# Patient Record
Sex: Male | Born: 1995 | Race: White | Hispanic: No | Marital: Single | Smoking: Never smoker
Health system: Southern US, Community
[De-identification: ages and names within clinical notes are randomized; demographics above are authoritative.]

## PROBLEM LIST (undated history)

## (undated) DIAGNOSIS — W540XXA Bitten by dog, initial encounter: Secondary | ICD-10-CM

---
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
## 2013-06-06 DIAGNOSIS — S99919A Unspecified injury of unspecified ankle, initial encounter: Principal | ICD-10-CM

## 2013-06-06 DIAGNOSIS — Y9301 Activity, walking, marching and hiking: Secondary | ICD-10-CM | POA: Insufficient documentation

## 2013-06-06 DIAGNOSIS — Y929 Unspecified place or not applicable: Secondary | ICD-10-CM | POA: Insufficient documentation

## 2013-06-06 DIAGNOSIS — S9000XA Contusion of unspecified ankle, initial encounter: Secondary | ICD-10-CM | POA: Insufficient documentation

## 2013-06-06 DIAGNOSIS — X500XXA Overexertion from strenuous movement or load, initial encounter: Secondary | ICD-10-CM | POA: Insufficient documentation

## 2013-06-06 DIAGNOSIS — Z791 Long term (current) use of non-steroidal anti-inflammatories (NSAID): Secondary | ICD-10-CM | POA: Insufficient documentation

## 2013-06-06 DIAGNOSIS — S8990XA Unspecified injury of unspecified lower leg, initial encounter: Secondary | ICD-10-CM | POA: Insufficient documentation

## 2013-06-06 DIAGNOSIS — Z880 Allergy status to penicillin: Secondary | ICD-10-CM | POA: Insufficient documentation

## 2013-06-06 DIAGNOSIS — S99929A Unspecified injury of unspecified foot, initial encounter: Principal | ICD-10-CM

## 2013-06-07 ENCOUNTER — Encounter (HOSPITAL_COMMUNITY): Payer: Self-pay | Admitting: Emergency Medicine

## 2013-06-07 ENCOUNTER — Emergency Department (HOSPITAL_COMMUNITY): Payer: Medicaid Other

## 2013-06-07 ENCOUNTER — Emergency Department (HOSPITAL_COMMUNITY)
Admission: EM | Admit: 2013-06-07 | Discharge: 2013-06-07 | Disposition: A | Discharge status: Home / Self Care | Payer: Medicaid Other | Attending: Emergency Medicine | Admitting: Emergency Medicine

## 2013-06-07 DIAGNOSIS — M25572 Pain in left ankle and joints of left foot: Secondary | ICD-10-CM

## 2013-06-07 HISTORY — DX: Bitten by dog, initial encounter: W54.0XXA

## 2013-06-07 MED ORDER — IBUPROFEN 800 MG PO TABS: 800.0000 mg | ORAL_TABLET | Freq: Three times a day (TID) | ORAL | Status: DC

## 2013-06-07 MED ORDER — IBUPROFEN 400 MG PO TABS
800.0000 mg | ORAL_TABLET | Freq: Once | ORAL | Status: AC
  Administered 2013-06-07: 800 mg via ORAL
  Filled 2013-06-07: qty 2

## 2013-06-07 NOTE — ED Notes (Signed)
Pt reports he fell down steps and twisted his L ankle. Pms intact. Ankle appears slightly swollen

## 2013-06-07 NOTE — Discharge Instructions (Signed)
Take Ibuprofen for pain  Use RICE method - see below Return to the emergency department if you develop any changing/worsening condition or any other concerns (please read additional information regarding your condition below)   Ankle Pain Ankle pain is a common symptom. The bones, cartilage, tendons, and muscles of the ankle joint perform a lot of work each day. The ankle joint holds your body weight and allows you to move around. Ankle pain can occur on either side or back of 1 or both ankles. Ankle pain may be sharp and burning or dull and aching. There may be tenderness, stiffness, redness, or warmth around the ankle. The pain occurs more often when a person walks or puts pressure on the ankle. CAUSES  There are many reasons ankle pain can develop. It is important to work with your caregiver to identify the cause since many conditions can impact the bones, cartilage, muscles, and tendons. Causes for ankle pain include:  Injury, including a break (fracture), sprain, or strain often due to a fall, sports, or a high-impact activity.  Swelling (inflammation) of a tendon (tendonitis).  Achilles tendon rupture.  Ankle instability after repeated sprains and strains.  Poor foot alignment.  Pressure on a nerve (tarsal tunnel syndrome).  Arthritis in the ankle or the lining of the ankle.  Crystal formation in the ankle (gout or pseudogout). DIAGNOSIS  A diagnosis is based on your medical history, your symptoms, results of your physical exam, and results of diagnostic tests. Diagnostic tests may include X-ray exams or a computerized magnetic scan (magnetic resonance imaging, MRI). TREATMENT  Treatment will depend on the cause of your ankle pain and may include:  Keeping pressure off the ankle and limiting activities.  Using crutches or other walking support (a cane or brace).  Using rest, ice, compression, and elevation.  Participating in physical therapy or home exercises.  Wearing  shoe inserts or special shoes.  Losing weight.  Taking medications to reduce pain or swelling or receiving an injection.  Undergoing surgery. HOME CARE INSTRUCTIONS   Only take over-the-counter or prescription medicines for pain, discomfort, or fever as directed by your caregiver.  Put ice on the injured area.  Put ice in a plastic bag.  Place a towel between your skin and the bag.  Leave the ice on for 15-20 minutes at a time, 03-04 times a day.  Keep your leg raised (elevated) when possible to lessen swelling.  Avoid activities that cause ankle pain.  Follow specific exercises as directed by your caregiver.  Record how often you have ankle pain, the location of the pain, and what it feels like. This information may be helpful to you and your caregiver.  Ask your caregiver about returning to work or sports and whether you should drive.  Follow up with your caregiver for further examination, therapy, or testing as directed. SEEK MEDICAL CARE IF:   Pain or swelling continues or worsens beyond 1 week.  You have an oral temperature above 102 F (38.9 C).  You are feeling unwell or have chills.  You are having an increasingly difficult time with walking.  You have loss of sensation or other new symptoms.  You have questions or concerns. MAKE SURE YOU:   Understand these instructions.  Will watch your condition.  Will get help right away if you are not doing well or get worse. Document Released: 06/18/2009 Document Revised: 03/23/2011 Document Reviewed: 06/18/2009 [facility] Patient Information 2014 [place].  RICE: Routine Care for Injuries The  routine care of many injuries includes Rest, Ice, Compression, and Elevation (RICE). HOME CARE INSTRUCTIONS  Rest is needed to allow your body to heal. Routine activities can usually be resumed when comfortable. Injured tendons and bones can take up to 6 weeks to heal. Tendons are the cord-like structures that attach  muscle to bone.  Ice following an injury helps keep the swelling down and reduces pain.  Put ice in a plastic bag.  Place a towel between your skin and the bag.  Leave the ice on for 15-20 minutes, 03-04 times a day. Do this while awake, for the first 24 to 48 hours. After that, continue as directed by your caregiver.  Compression helps keep swelling down. It also gives support and helps with discomfort. If an elastic bandage has been applied, it should be removed and reapplied every 3 to 4 hours. It should not be applied tightly, but firmly enough to keep swelling down. Watch fingers or toes for swelling, bluish discoloration, coldness, numbness, or excessive pain. If any of these problems occur, remove the bandage and reapply loosely. Contact your caregiver if these problems continue.  Elevation helps reduce swelling and decreases pain. With extremities, such as the arms, hands, legs, and feet, the injured area should be placed near or above the level of the heart, if possible. SEEK IMMEDIATE MEDICAL CARE IF:  You have persistent pain and swelling.  You develop redness, numbness, or unexpected weakness.  Your symptoms are getting worse rather than improving after several days. These symptoms may indicate that further evaluation or further X-rays are needed. Sometimes, X-rays may not show a small broken bone (fracture) until 1 week or 10 days later. Make a follow-up appointment with your caregiver. Ask when your X-ray results will be ready. Make sure you get your X-ray results. Document Released: 04/12/2000 Document Revised: 03/23/2011 Document Reviewed: 05/30/2010 [facility] Patient Information 2014 [place], [place].

## 2013-06-08 NOTE — ED Provider Notes (Signed)
Medical screening examination/treatment/procedure(s) were performed by non-physician practitioner and as supervising physician I was immediately available for consultation/collaboration.   EKG Interpretation None        Julia Alkhatib M Deah Ottaway, MD 06/08/13 1546 

## 2013-06-08 NOTE — ED Provider Notes (Signed)
CSN: [id number]     Arrival date & time 06/06/13  2335 History   First MD Initiated Contact with Patient 06/07/13 [id number]     Chief Complaint  Patient presents with  . Ankle Pain   HPI  Alan Potter is a 18 y.o. male with no PMH who presents to the ED for evaluation of ankle pain. History was provided by the patient. Patient states that he was walking down the stairs this evening when he missed his step and twisted his left ankle. Denies any other injuries or trauma including head injury or LOC. Patient complains of a constant throbbing left lateral ankle pain which is worse with movement. Nothing improves his pain. Nothing taken for pain PTA. No weakness, loss of sensation, numbness or tingling.    Past Medical History  Diagnosis Date  . Dog bite     mauled by pit bull as child.    History reviewed. No pertinent past surgical history. No family history on file. History  Substance Use Topics  . Smoking status: Never Smoker   . Smokeless tobacco: Not on file  . Alcohol Use: Yes     Comment: occasionally    Review of Systems  Constitutional: Negative for fever, activity change and appetite change.  Cardiovascular: Negative for leg swelling.  Musculoskeletal: Positive for arthralgias, gait problem (due to pain) and joint swelling. Negative for back pain, myalgias and neck pain.  Skin: Positive for color change. Negative for wound.  Neurological: Negative for weakness and numbness.     Allergies  Hydrocodone and Penicillins  Home Medications   Prior to Admission medications   Medication Sig Start Date End Date Taking? Authorizing Provider  ibuprofen (ADVIL,MOTRIN) 800 MG tablet Take 1 tablet (800 mg total) by mouth 3 (three) times daily. 06/07/13   Alan Harvey Ladarian Bonczek, PA-C   BP 131/98  Pulse 98  Temp(Src) 98.9 F (37.2 C) (Oral)  Resp 14  SpO2 100%  Filed Vitals:   06/07/13 0228  BP: 131/98  Pulse: 98  Temp: 98.9 F (37.2 C)  TempSrc: Oral  Resp: 14  SpO2: 100%     Physical Exam  Nursing note and vitals reviewed. Constitutional: He is oriented to person, place, and time. He appears well-developed and well-nourished. No distress.  HENT:  Head: Normocephalic and atraumatic.  Right Ear: External ear normal.  Left Ear: External ear normal.  Mouth/Throat: Oropharynx is clear and moist.  Eyes: Conjunctivae are normal. Right eye exhibits no discharge. Left eye exhibits no discharge.  Neck: Neck supple.  Cardiovascular: Normal rate.   Dorsalis pedis pulses present and equal bilaterally  Pulmonary/Chest: Effort normal.  Abdominal: Soft.  Musculoskeletal: Normal range of motion. He exhibits edema and tenderness.       Feet:  Tenderness to palpation to the left lateral malleolus with associated edema and ecchymosis. No tenderness to palpation to the digits, calf, or knee. ROM intact in the digits and is limited in the left ankle due to pain.   Neurological: He is alert and oriented to person, place, and time.  Sensation intact in the LE bilaterally  Skin: Skin is warm and dry. He is not diaphoretic.    ED Course  Procedures (including critical care time) Labs Review Labs Reviewed - No data to display  Imaging Review Dg Ankle Complete Left  06/07/2013   CLINICAL DATA:  Twisting injury to left ankle while on steps. Left ankle pain.  EXAM: LEFT ANKLE COMPLETE - 3+ VIEW  COMPARISON:  None.  FINDINGS: There is no evidence of fracture or dislocation. The ankle mortise is intact; the interosseous space is within normal limits. No talar tilt or subluxation is seen.  The joint spaces are preserved. Mild lateral soft tissue swelling is noted.  IMPRESSION: No evidence of fracture or dislocation.   Electronically Signed   By: Roanna RaiderJeffery  Chang M.D.   On: 06/07/2013 02:22     EKG Interpretation None      MDM   Alan Potter is a 18 y.o. male with no PMH who presents to the ED for evaluation of ankle pain. Ankle pain possibly due to sprain. X-rays negative  for fracture or malalignment. Patient neurovascularly intact. Provided with splint and crutches. RICE method discussed. Follow-up with PCP if symptoms not improving or worsening. Return precautions, discharge instructions, and follow-up was discussed with the patient before discharge.    Discharge Medication List as of 06/07/2013  2:33 AM    START taking these medications   Details  ibuprofen (ADVIL,MOTRIN) 800 MG tablet Take 1 tablet (800 mg total) by mouth 3 (three) times daily., Starting 06/07/2013, Until Discontinued, Print        Final impressions: 1. Left ankle pain       Greer EeJessica Katlin Johnavon Mcclafferty PA-C           Alan Potter, [place]PA-C 06/08/13 1316

## 2014-05-17 ENCOUNTER — Encounter (HOSPITAL_COMMUNITY): Payer: Self-pay

## 2014-05-17 ENCOUNTER — Emergency Department (HOSPITAL_COMMUNITY)
Admission: EM | Admit: 2014-05-17 | Discharge: 2014-05-17 | Disposition: A | Discharge status: Home / Self Care | Payer: Medicaid Other | Attending: Emergency Medicine | Admitting: Emergency Medicine

## 2014-05-17 ENCOUNTER — Emergency Department (HOSPITAL_COMMUNITY): Payer: Medicaid Other

## 2014-05-17 DIAGNOSIS — R51 Headache: Secondary | ICD-10-CM | POA: Insufficient documentation

## 2014-05-17 DIAGNOSIS — M542 Cervicalgia: Secondary | ICD-10-CM | POA: Diagnosis not present

## 2014-05-17 DIAGNOSIS — H53149 Visual discomfort, unspecified: Secondary | ICD-10-CM | POA: Diagnosis not present

## 2014-05-17 DIAGNOSIS — F410 Panic disorder [episodic paroxysmal anxiety] without agoraphobia: Secondary | ICD-10-CM

## 2014-05-17 DIAGNOSIS — Z87828 Personal history of other (healed) physical injury and trauma: Secondary | ICD-10-CM | POA: Insufficient documentation

## 2014-05-17 DIAGNOSIS — Z880 Allergy status to penicillin: Secondary | ICD-10-CM | POA: Diagnosis not present

## 2014-05-17 DIAGNOSIS — Z791 Long term (current) use of non-steroidal anti-inflammatories (NSAID): Secondary | ICD-10-CM | POA: Diagnosis not present

## 2014-05-17 DIAGNOSIS — F419 Anxiety disorder, unspecified: Secondary | ICD-10-CM | POA: Diagnosis not present

## 2014-05-17 DIAGNOSIS — R079 Chest pain, unspecified: Secondary | ICD-10-CM | POA: Insufficient documentation

## 2014-05-17 LAB — BASIC METABOLIC PANEL
Anion gap: 10 (ref 5–15)
BUN: 8 mg/dL (ref 6–20)
CALCIUM: 9.4 mg/dL (ref 8.9–10.3)
CO2: 23 mmol/L (ref 22–32)
CREATININE: 0.94 mg/dL (ref 0.61–1.24)
Chloride: 107 mmol/L (ref 101–111)
GFR calc Af Amer: >60 mL/min (ref >60)
GFR calc non Af Amer: >60 mL/min (ref >60)
GLUCOSE: 131 mg/dL — AB (ref 70–99)
Potassium: 3.6 mmol/L (ref 3.5–5.1)
Sodium: 140 mmol/L (ref 135–145)

## 2014-05-17 LAB — CBC
HCT: 43.3 % (ref 39.0–52.0)
Hemoglobin: 14.6 g/dL (ref 13.0–17.0)
MCH: 30.2 pg (ref 26.0–34.0)
MCHC: 33.7 g/dL (ref 30.0–36.0)
MCV: 89.6 fL (ref 78.0–100.0)
Platelets: 306 10*3/uL (ref 150–400)
RBC: 4.83 MIL/uL (ref 4.22–5.81)
RDW: 12.3 % (ref 11.5–15.5)
WBC: 8.5 10*3/uL (ref 4.0–10.5)

## 2014-05-17 LAB — I-STAT TROPONIN, ED: Troponin i, poc: 0 ng/mL (ref 0.00–0.08)

## 2014-05-17 MED ORDER — LORAZEPAM 1 MG PO TABS: 1.0000 mg | ORAL_TABLET | Freq: Three times a day (TID) | ORAL | Status: AC | PRN

## 2014-05-17 NOTE — ED Provider Notes (Signed)
CSN: [id number]     Arrival date & time 05/17/14  1423 History  This chart was scribed for non-physician practitioner, Eyvonne MechanicJeffrey Sayre Witherington, working with Benjiman CoreNathan Pickering, MD by Richarda Overlieichard Holland, ED Scribe. This patient was seen in room TR10C/TR10C and the patient's care was started at 4:29 PM.  Chief Complaint  Patient presents with  . Chest Pain  . Headache   The history is provided by the patient. No language interpreter was used.   HPI Comments: Alan Potter is a 19 y.o. male with no significant medical history who presents to the Emergency Department complaining of left sided, sharp chest pain for the last several months that worsened last night.he reports that his CP radiates to no other locations. Pt states his CP lasted all night but states that he has not experienced CP today. Pt says he feels like his heart is fluttering during these episodes and thinks that his stress may attribute to his CP. He reports that he breaths faster during these episodes. Pt denies any known injures. He states that he had a HA located at his temples last night and reports associated photophobia and neck tightness as well. He states that he takes Excedrin for his HAs with relief. He reports a hx of marijuana use. He reports his grandfather is 552 has 4 heart stents, and began having heart problems in his late 240s. He states he takes no medications regularly. Pt reports no alleviating or exacerbating factors at this time. He denies SOB, cough, nausea or vomiting.   Past Medical History  Diagnosis Date  . Dog bite     mauled by pit bull as child.    History reviewed. No pertinent past surgical history. No family history on file. History  Substance Use Topics  . Smoking status: Never Smoker   . Smokeless tobacco: Not on file  . Alcohol Use: Yes     Comment: occasionally    Review of Systems  Respiratory: Negative for shortness of breath.   Cardiovascular: Positive for chest pain.  Gastrointestinal: Negative  for nausea and vomiting.  Neurological: Positive for headaches.  All other systems reviewed and are negative.  Allergies  Hydrocodone and Penicillins  Home Medications   Prior to Admission medications   Medication Sig Start Date End Date Taking? Authorizing Provider  ibuprofen (ADVIL,MOTRIN) 800 MG tablet Take 1 tablet (800 mg total) by mouth 3 (three) times daily. 06/07/13   Janene HarveyJessica K Palmer, PA-C   BP 136/78 mmHg  Pulse 81  Temp(Src) 98.3 F (36.8 C) (Oral)  Resp 16  Ht 5\' 9"  (1.753 m)  Wt 220 lb (99.791 kg)  BMI 32.47 kg/m2  SpO2 94%   Physical Exam  Constitutional: He is oriented to person, place, and time. He appears well-developed and well-nourished.  HENT:  Head: Normocephalic and atraumatic.  Eyes: Pupils are equal, round, and reactive to light.  Neck: Normal range of motion. Neck supple. No JVD present. No tracheal deviation present. No thyromegaly present.  Cardiovascular: Normal rate, regular rhythm, normal heart sounds and intact distal pulses.  Exam reveals no gallop and no friction rub.   No murmur heard. Pulmonary/Chest: Effort normal and breath sounds normal. No stridor. No respiratory distress. He has no wheezes. He has no rales. He exhibits no tenderness.  Abdominal: He exhibits no distension.  Musculoskeletal: Normal range of motion.  Lymphadenopathy:    He has no cervical adenopathy.  Neurological: He is alert and oriented to person, place, and time. Coordination normal.  Skin:  Skin is warm and dry.  Psychiatric: He has a normal mood and affect. His behavior is normal. Judgment and thought content normal.  Nursing note and vitals reviewed.  ED Course  Procedures   DIAGNOSTIC STUDIES: Oxygen Saturation is 94% on RA, normal by my interpretation.    COORDINATION OF CARE: 4:44 PM Discussed treatment plan with pt at bedside and pt agreed to plan. [facility] Told pt that if his pain worsens, radiates to any new  locations or he experiences nausea or vomiting to return to ED.    Labs Review Labs Reviewed  BASIC METABOLIC PANEL - Abnormal; Notable for the following:    Glucose, Bld 131 (*)    All other components within normal limits  CBC  I-STAT TROPOININ, ED    Imaging Review Dg Chest 2 View  05/17/2014   CLINICAL DATA:  Onset of headache and left-sided chest pain last night, cough, history of tobacco use  EXAM: CHEST  2 VIEW  COMPARISON:  None.  FINDINGS: The lungs are well-expanded and clear. There is no pneumothorax or pleural effusion. The heart and pulmonary vascularity are normal. The trachea is midline. The mediastinum is normal in width. The bony thorax is unremarkable.  IMPRESSION: There is no active cardiopulmonary disease.   Electronically Signed   By: David  SwazilandJordan M.D.   On: 05/17/2014 14:52     EKG Interpretation NSR 66 BPM Normal EKG no st changes     MDM   Final diagnoses:  Anxiety attack  Chest pain, unspecified chest pain type    Labs: I-STAT troponin, CBC, BMP- no significant findings  Imaging: DG chest no acute findings  Consults: None  Therapeutics: None  Assessment: Anxiety attack  Plan: Patient presents with an anxiety attack with associated chest pain. Patient reports that he suffered with difficulty managing his anxiety for extended period of time. He reports that when he gets overwhelmed he begins to have rapid heart rate, rapid breathing, and chest pain. He states that time was only thing that allows him reduction and symptoms. He reports the chest pain is not associated with nausea, vomiting, diaphoresis, or radiation of symptoms. He reports that he does not use drugs or alcohol. He states that the chest pain is only associated with times of anxiety, and resolves without complication. Patient is young healthy does not take any medications, no family history of heart disease at a young age, heart sounds normal, DG chest normal, negative troponin, normal EKG.  Unlikely this is ACS, or arrhythmia. Patient reports that he used to take a antidepressant and was not having symptoms while taking it. Patient is advised to use medication only as needed for anxiety attacks, and follow-up in emergency room if symptoms persist or worsen. [facility] is [facility], and was given cardiology consult as well. Patient verbally understood and agreed to today's plan, he was asymptomatic throughout my evaluation, he was discharged home with no further questions or concerns. He is instructed not to drink, drive,  Work, or operate heavy machinery while taking medication.  I personally performed the services described in this documentation, which was scribed in my presence. The recorded information has been reviewed and is accurate.       Eyvonne MechanicJeffrey Koa Zoeller, PA-C 05/17/14 2214  Mancel BaleElliott Wentz, MD 05/18/14 [phone]

## 2014-05-17 NOTE — ED Notes (Signed)
Pt stable, ambulatory, states understanding of discharge instructions 

## 2014-05-17 NOTE — ED Notes (Signed)
Started having a headache last night and then started having left sided cp. Denies any N/V.

## 2014-05-17 NOTE — Discharge Instructions (Signed)
Please use medication only as directed for anxiety attacks. Do not drink, drive, or operate any machinery while taking. Please follow-up with your primary care provider or [place] [facility] as needed for further and long-term management of anxiety. If new or worsening signs or symptoms present please return immediately to the emergency room for further evaluation.

## 2014-10-26 ENCOUNTER — Emergency Department: Payer: Medicaid Other

## 2014-10-26 ENCOUNTER — Emergency Department
Admission: EM | Admit: 2014-10-26 | Discharge: 2014-10-26 | Disposition: A | Discharge status: Home / Self Care | Payer: Medicaid Other | Attending: Emergency Medicine | Admitting: Emergency Medicine

## 2014-10-26 DIAGNOSIS — X58XXXA Exposure to other specified factors, initial encounter: Secondary | ICD-10-CM | POA: Diagnosis not present

## 2014-10-26 DIAGNOSIS — Y92310 Basketball court as the place of occurrence of the external cause: Secondary | ICD-10-CM | POA: Diagnosis not present

## 2014-10-26 DIAGNOSIS — S93401A Sprain of unspecified ligament of right ankle, initial encounter: Secondary | ICD-10-CM | POA: Diagnosis not present

## 2014-10-26 DIAGNOSIS — S99911A Unspecified injury of right ankle, initial encounter: Secondary | ICD-10-CM | POA: Diagnosis present

## 2014-10-26 DIAGNOSIS — S8261XA Displaced fracture of lateral malleolus of right fibula, initial encounter for closed fracture: Secondary | ICD-10-CM | POA: Diagnosis not present

## 2014-10-26 DIAGNOSIS — Y998 Other external cause status: Secondary | ICD-10-CM | POA: Diagnosis not present

## 2014-10-26 DIAGNOSIS — Z880 Allergy status to penicillin: Secondary | ICD-10-CM | POA: Diagnosis not present

## 2014-10-26 DIAGNOSIS — Y9367 Activity, basketball: Secondary | ICD-10-CM | POA: Diagnosis not present

## 2014-10-26 DIAGNOSIS — R52 Pain, unspecified: Secondary | ICD-10-CM

## 2014-10-26 MED ORDER — IBUPROFEN 800 MG PO TABS: 800.0000 mg | ORAL_TABLET | Freq: Three times a day (TID) | ORAL | Status: AC

## 2014-10-26 MED ORDER — TRAMADOL HCL 50 MG PO TABS: 50.0000 mg | ORAL_TABLET | Freq: Four times a day (QID) | ORAL | Status: AC | PRN

## 2014-10-26 NOTE — ED Notes (Signed)
Patient was playing basketball yesterday and rolled his right ankle. Didn't pay too much attention to it, kept playing and resumed normal activities. Went to bed and was awakened in the night by the pain when he attempted weight bearing. RIght ankle with swelling, inability to bear weight and bruising to lateral malleolus.

## 2014-10-26 NOTE — ED Notes (Signed)
Twisted right ankle last pm while playing b/b  Right ankle swollen and tender positive pulses and good circulation.unable to bear wt.

## 2014-10-26 NOTE — Discharge Instructions (Signed)
Ankle Sprain An ankle sprain is an injury to the strong, fibrous tissues (ligaments) that hold your ankle bones together.  HOME CARE   Put ice on your ankle for 1-2 days or as told by your doctor.  Put ice in a plastic bag.  Place a towel between your skin and the bag.  Leave the ice on for 15-20 minutes at a time, every 2 hours while you are awake.  Only take medicine as told by your doctor.  Raise (elevate) your injured ankle above the level of your heart as much as possible for 2-3 days.  Use crutches if your doctor tells you to. Slowly put your own weight on the affected ankle. Use the crutches until you can walk without pain.  If you have a plaster splint:  Do not rest it on anything harder than a pillow for 24 hours.  Do not put weight on it.  Do not get it wet.  Take it off to shower or bathe.  If given, use an elastic wrap or support stocking for support. Take the wrap off if your toes lose feeling (numb), tingle, or turn cold or blue.  If you have an air splint:  Add or let out air to make it comfortable.  Take it off at night and to shower and bathe.  Wiggle your toes and move your ankle up and down often while you are wearing it. GET HELP IF:  You have rapidly increasing bruising or puffiness (swelling).  Your toes feel very cold.  You lose feeling in your foot.  Your medicine does not help your pain. GET HELP RIGHT AWAY IF:   Your toes lose feeling (numb) or turn blue.  You have severe pain that is increasing. MAKE SURE YOU:   Understand these instructions.  Will watch your condition.  Will get help right away if you are not doing well or get worse.   This information is not intended to replace advice given to you by your health care provider. Make sure you discuss any questions you have with your health care provider.   Document Released: 06/17/2007 Document Revised: 01/19/2014 Document Reviewed: 07/13/2011 [employer] Interactive Patient  Education 2016 [employer]    Ice and elevate to reduce swelling. Use crutches for walking. Take ibuprofen 3 times a day with food and tramadol as needed for pain. Follow-up with Dr. Martha ClanKrasinski if any continued problems

## 2014-10-26 NOTE — ED Provider Notes (Signed)
[facility] Emergency Department Provider Note  ____________________________________________  Time seen: Approximately 7:11 AM  I have reviewed the triage vital signs and the nursing notes.   HISTORY  Chief Complaint Ankle Pain  HPI Alan Potter is a 19 y.o. male is here complaint of right ankle pain since last evening. Patient states he was playing basketball and rolled his ankle. He continued to play after his accident but began having pain during the night. Pain is increased with attempted weightbearing. Patient has not taken any over-the-counter medication for his pain. He denies any previous injury to his ankle. He denies any head injury or loss of consciousness during this time. Currently he rates his pain as 6 out of 10.   Past Medical History  Diagnosis Date  . Dog bite     mauled by pit bull as child.     There are no active problems to display for this patient.   History reviewed. No pertinent past surgical history.  Current Outpatient Rx  Name  Route  Sig  Dispense  Refill  . ibuprofen (ADVIL,MOTRIN) 800 MG tablet   Oral   Take 1 tablet (800 mg total) by mouth 3 (three) times daily.   30 tablet   0   . LORazepam (ATIVAN) 1 MG tablet   Oral   Take 1 tablet (1 mg total) by mouth 3 (three) times daily as needed for anxiety.   10 tablet   0   . traMADol (ULTRAM) 50 MG tablet   Oral   Take 1 tablet (50 mg total) by mouth every 6 (six) hours as needed for moderate pain.   30 tablet   0     Allergies Hydrocodone and Penicillins  No family history on file.  Social History Social History  Substance Use Topics  . Smoking status: Never Smoker   . Smokeless tobacco: None  . Alcohol Use: Yes     Comment: occasionally    Review of Systems Constitutional: No fever/chills Eyes: No visual changes. ENT: No sore throat. Cardiovascular: Denies chest pain. Respiratory: Denies shortness of breath. Gastrointestinal:   No nausea, no  vomiting.  Genitourinary: Negative for dysuria. Musculoskeletal: Negative for back pain. Positive right ankle pain Skin: Negative for rash. Neurological: Negative for headaches, focal weakness or numbness.  10-point ROS otherwise negative.  ____________________________________________   PHYSICAL EXAM:  VITAL SIGNS: ED Triage Vitals  Enc Vitals Group     BP 10/26/14 0435 135/85 mmHg     Pulse Rate 10/26/14 0435 84     Resp 10/26/14 0435 18     Temp 10/26/14 0435 98.1 F (36.7 C)     Temp Source 10/26/14 0435 Oral     SpO2 10/26/14 0435 100 %     Weight 10/26/14 0435 220 lb (99.791 kg)     Height 10/26/14 0435 5\' 9"  (1.753 m)     Head Cir --      Peak Flow --      Pain Score 10/26/14 0436 6     Pain Loc --      Pain Edu? --      Excl. in GC? --    Constitutional: Alert and oriented. Well appearing and in no acute distress. Eyes: Conjunctivae are normal. PERRL. EOMI. Head: Atraumatic. Nose: No congestion/rhinnorhea. Mouth/Throat: Mucous membranes are moist.  Oropharynx non-erythematous. Neck: No stridor.   Cardiovascular: Normal rate, regular rhythm. Grossly normal heart sounds.  Good peripheral circulation. Respiratory: Normal respiratory effort.  No retractions.  Lungs CTAB. Gastrointestinal: Soft and nontender. No distention.  Musculoskeletal: Right ankle swollen with moderate edema lateral aspect and marked tenderness. No gross deformity noted. Range of motion is restricted secondary to patient's pain. Neurologic:  Normal speech and language. No gross focal neurologic deficits are appreciated. No gait instability. Skin:  Skin is warm, dry and intact. No rash noted. Right lateral aspect with ecchymosis but no abrasions. Psychiatric: Mood and affect are normal. Speech and behavior are normal.  ____________________________________________   LABS (all labs ordered are listed, but only abnormal results are displayed)  Labs Reviewed - No data to  display  RADIOLOGY  Right ankle per radiologist shows tiny ossicle inferior to the lateral malleolus that may represent an avulsion fracture I, Tommi Rumps, personally viewed and evaluated these images (plain radiographs) as part of my medical decision making.  ____________________________________________   PROCEDURES  Procedure(s) performed: None  Critical Care performed: No  ____________________________________________   INITIAL IMPRESSION / ASSESSMENT AND PLAN / ED COURSE  Pertinent labs & imaging results that were available during my care of the patient were reviewed by me and considered in my medical decision making (see chart for details)  Patient was given tramadol and ibuprofen as needed for pain. Patient was placed and a ankle stirrup splint and given crutches. He is to follow-up with Dr. Martha Clan if not improving in one to 2 weeks. He was also encouraged to ice and elevate his ankle for swelling.  ____________________________________________   FINAL CLINICAL IMPRESSION(S) / ED DIAGNOSES  Final diagnoses:  Sprain of right ankle, initial encounter  avulsion fracture right lateral malleolus    Tommi Rumps, PA-C 10/26/14 [id number]  Sharyn Creamer, MD 10/26/14 [phone]

## 2017-02-09 IMAGING — CR DG ANKLE COMPLETE 3+V*R*
1 series · 3 of 3 positions shown · non-contrast
Comparison: None.

CLINICAL DATA: Injury to the right ankle yesterday playing
basketball. Persistent pain on weight-bearing. Swelling and
bruising.

EXAM:
RIGHT ANKLE - COMPLETE 3+ VIEW

[Series 1: x ankle ap right · 0.14mm/px · 3 of 3 slices shown]
[im 1/3]
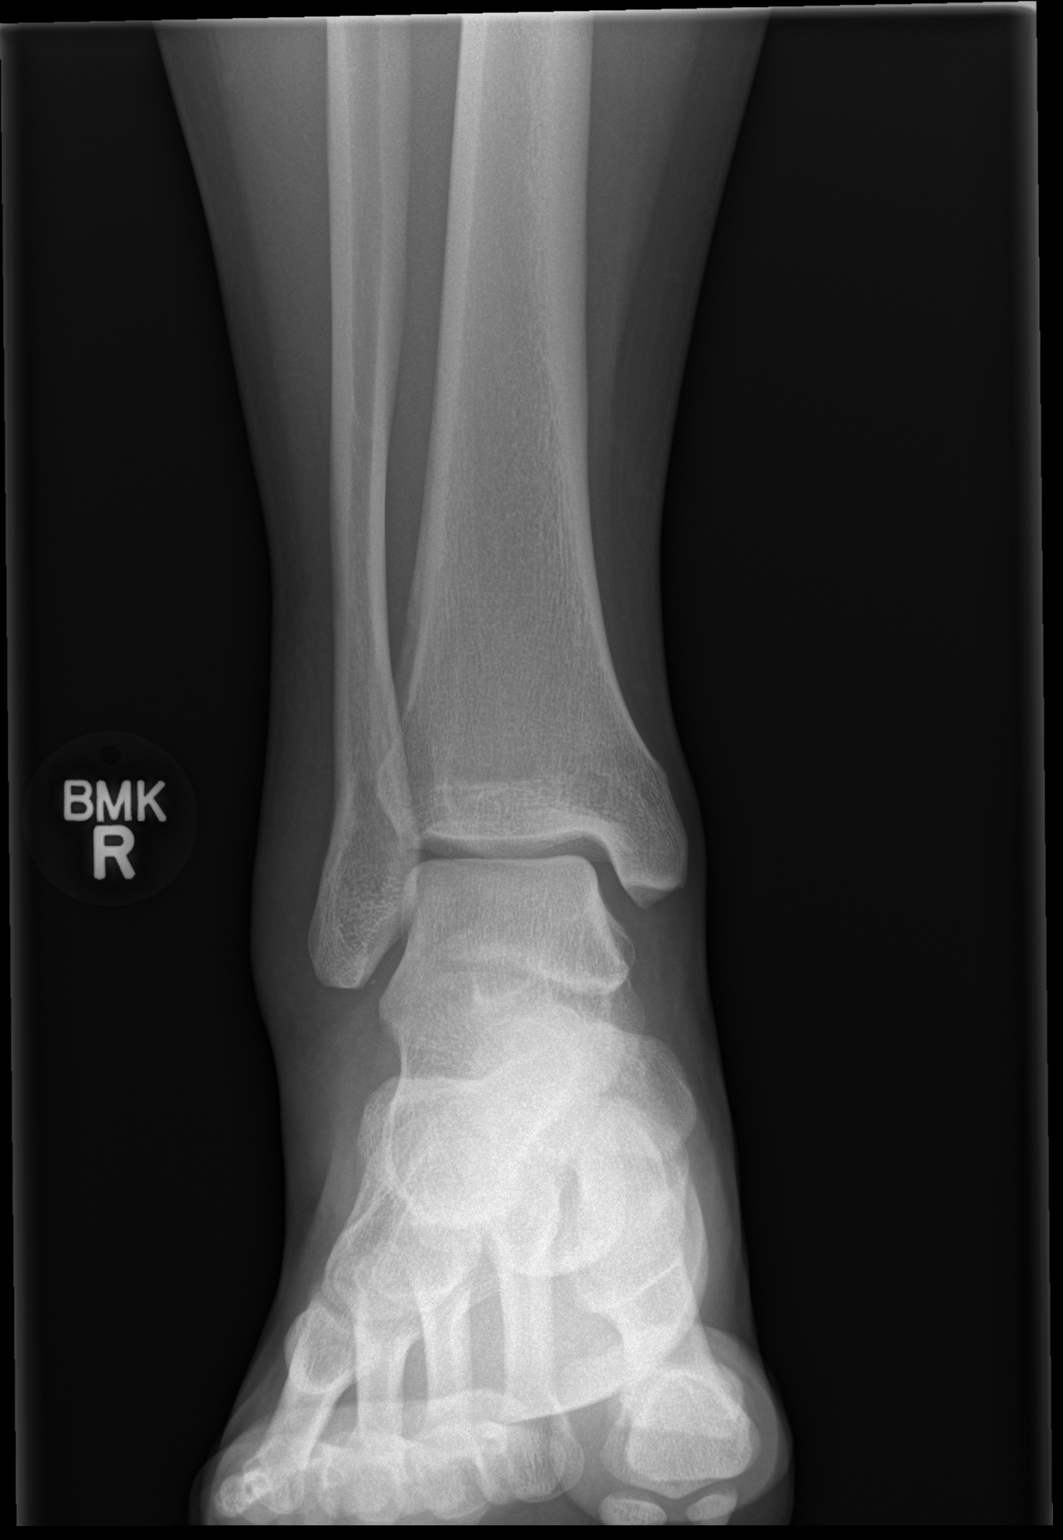
[im 2/3]
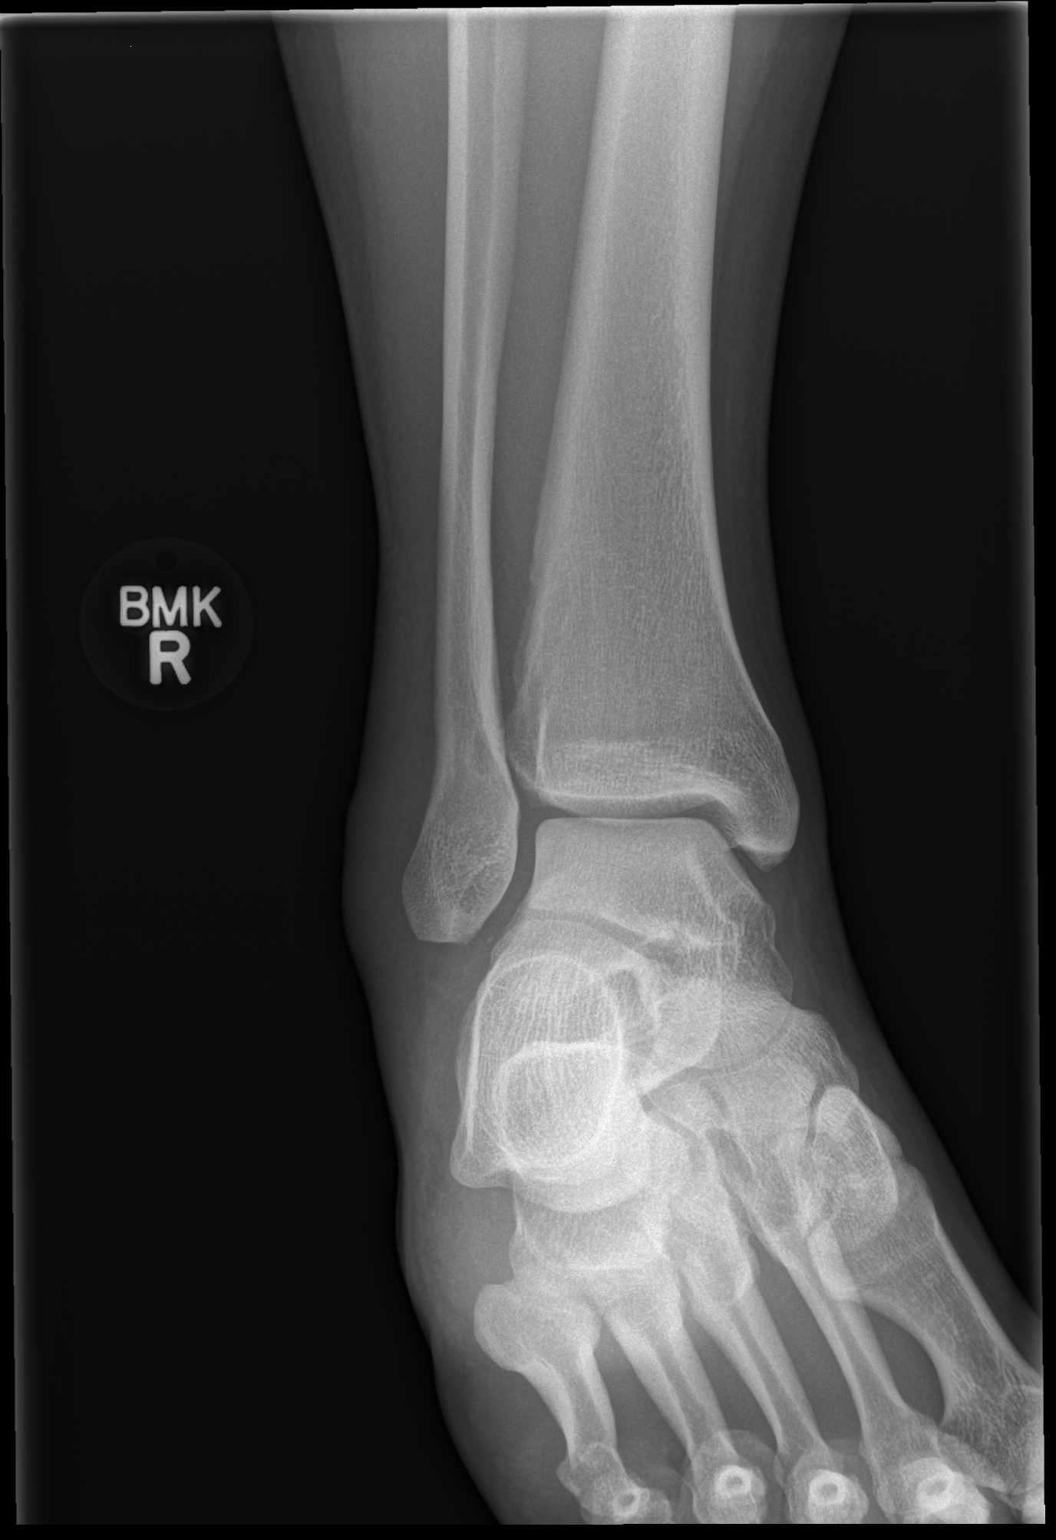
[im 3/3]
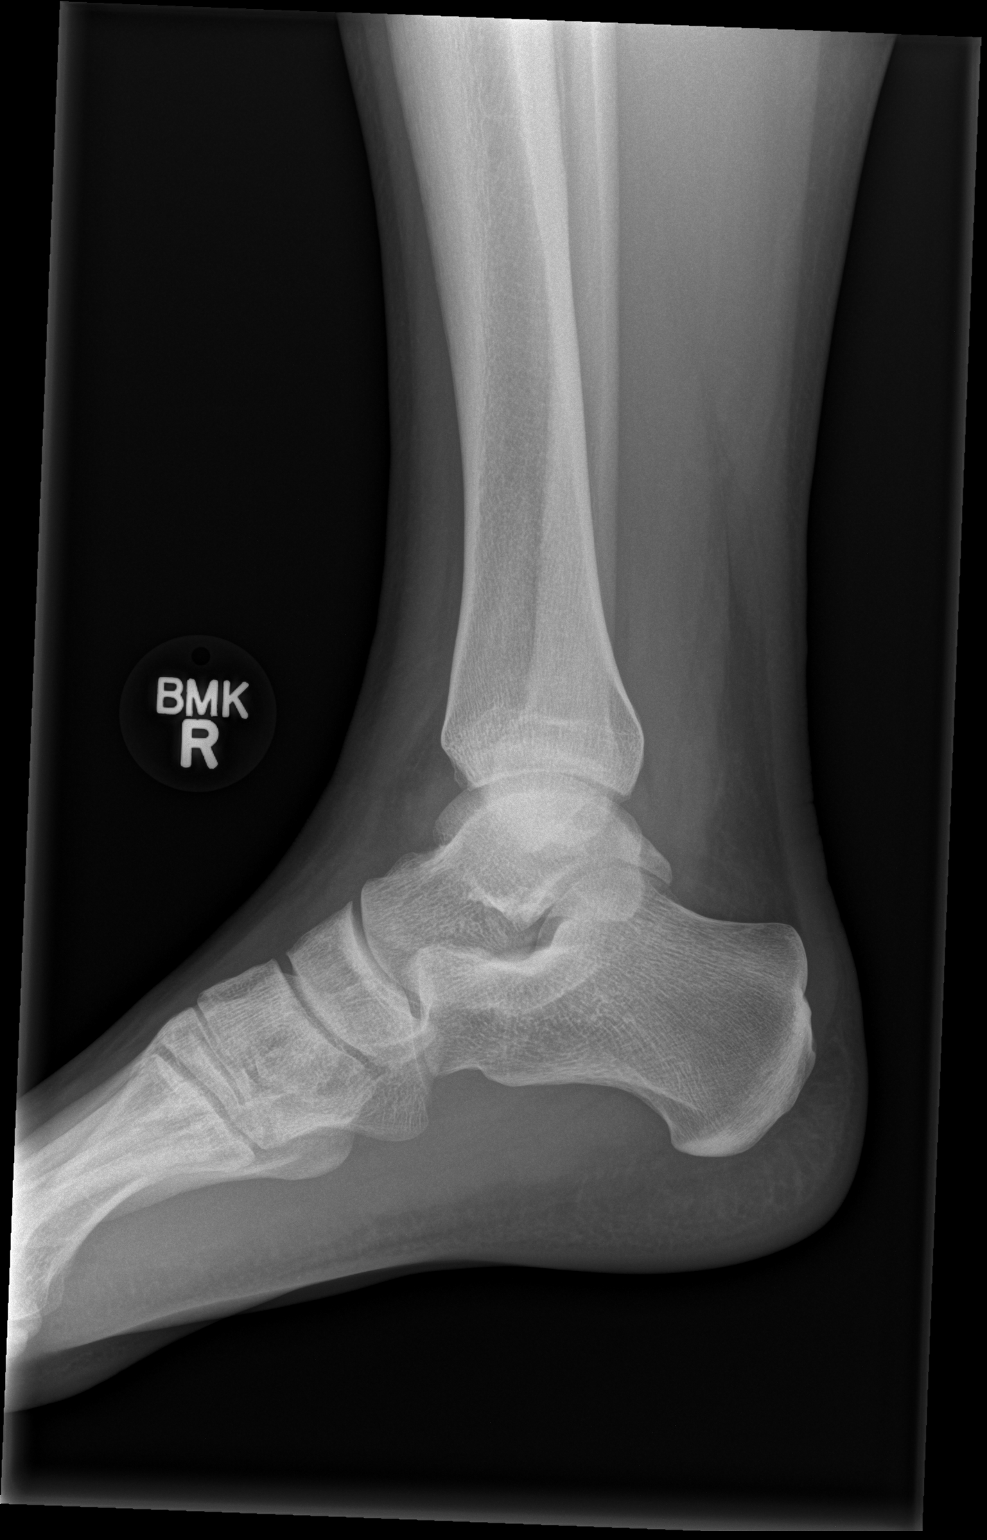

[3 of 3 positions shown; findings below may reference images not displayed]

FINDINGS: Lateral soft tissue swelling about the right ankle. Tiny ossicle
inferior to the lateral malleolus may represent a small avulsion
fragment. No acute displaced fractures identified. Ankle mortise and
talar dome appear intact. No focal bone lesion or bone destruction.
IMPRESSION: Tiny ossicle inferior to the lateral malleolus may represent a small
avulsion fragment. Soft tissue swelling about the lateral malleolus.

## 2018-07-27 ENCOUNTER — Other Ambulatory Visit: Payer: Self-pay

## 2018-07-27 DIAGNOSIS — Z20822 Contact with and (suspected) exposure to covid-19: Secondary | ICD-10-CM

## 2018-07-31 LAB — NOVEL CORONAVIRUS, NAA: SARS-CoV-2, NAA: NOT DETECTED
